# Patient Record
Sex: Female | Born: 2000 | Race: Black or African American | Hispanic: No | Marital: Single | Smoking: Never smoker
Health system: Southern US, Community
[De-identification: ages and names within clinical notes are randomized; demographics above are authoritative.]

## PROBLEM LIST (undated history)

## (undated) DIAGNOSIS — J45909 Unspecified asthma, uncomplicated: Secondary | ICD-10-CM

## (undated) DIAGNOSIS — F419 Anxiety disorder, unspecified: Secondary | ICD-10-CM

---
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
## 2018-11-29 ENCOUNTER — Other Ambulatory Visit: Payer: Self-pay

## 2018-11-29 DIAGNOSIS — Z20822 Contact with and (suspected) exposure to covid-19: Secondary | ICD-10-CM

## 2018-11-30 LAB — NOVEL CORONAVIRUS, NAA: SARS-CoV-2, NAA: NOT DETECTED

## 2019-05-02 ENCOUNTER — Other Ambulatory Visit: Payer: Self-pay

## 2019-05-02 ENCOUNTER — Ambulatory Visit: Payer: Medicaid Other | Attending: Internal Medicine

## 2019-05-02 DIAGNOSIS — Z20822 Contact with and (suspected) exposure to covid-19: Secondary | ICD-10-CM

## 2019-05-03 LAB — NOVEL CORONAVIRUS, NAA: SARS-CoV-2, NAA: NOT DETECTED

## 2020-07-08 ENCOUNTER — Ambulatory Visit
Admission: EM | Admit: 2020-07-08 | Discharge: 2020-07-08 | Disposition: A | Discharge status: Home / Self Care | Payer: 59

## 2020-07-08 ENCOUNTER — Other Ambulatory Visit: Payer: Self-pay

## 2020-07-08 ENCOUNTER — Encounter: Payer: Self-pay | Admitting: Emergency Medicine

## 2020-07-08 DIAGNOSIS — H60501 Unspecified acute noninfective otitis externa, right ear: Secondary | ICD-10-CM

## 2020-07-08 DIAGNOSIS — R0981 Nasal congestion: Secondary | ICD-10-CM | POA: Diagnosis not present

## 2020-07-08 HISTORY — DX: Unspecified asthma, uncomplicated: J45.909

## 2020-07-08 HISTORY — DX: Anxiety disorder, unspecified: F41.9

## 2020-07-08 MED ORDER — AMOXICILLIN 875 MG PO TABS: 875.0000 mg | ORAL_TABLET | Freq: Two times a day (BID) | ORAL | 0 refills | Status: AC

## 2020-07-08 MED ORDER — PSEUDOEPHEDRINE HCL 30 MG PO TABS: 30.0000 mg | ORAL_TABLET | ORAL | 0 refills | Status: DC | PRN

## 2020-07-08 NOTE — ED Provider Notes (Signed)
[facility]    CSN: [id number] Arrival date & time: 07/08/20  0932      History   Chief Complaint No chief complaint on file.   HPI Melissa Porter is a 20 y.o. female.   HPI  Patient presents today with right ear pain, nasal congestion, and  nasal drainage x 2 days. History of asthma, however, no asthma symptoms present over the last few days. She has taken Mucinex and Nyquil without relief of symptoms. Past Medical History:  Diagnosis Date  . Anxiety   . Asthma     There are no problems to display for this patient.   History reviewed. No pertinent surgical history.  OB History   No obstetric history on file.      Home Medications    Prior to Admission medications   Medication Sig Start Date End Date Taking? Authorizing Provider  albuterol (VENTOLIN HFA) 108 (90 Base) MCG/ACT inhaler Inhale into the lungs every 6 (six) hours as needed for wheezing or shortness of breath.   Yes [provider]  atomoxetine (STRATTERA) 40 MG capsule Take 40 mg by mouth daily.   Yes [provider]  escitalopram (LEXAPRO) 10 MG tablet Take 10 mg by mouth daily.   Yes [provider]  montelukast (SINGULAIR) 10 MG tablet Take 10 mg by mouth at bedtime.   Yes [provider]  triamcinolone (KENALOG) 0.1 % Apply 1 application topically 2 (two) times daily.   Yes [provider]    Family History History reviewed. No pertinent family history.  Social History Social History   Tobacco Use  . Smoking status: Never Smoker  . Smokeless tobacco: Never Used  Substance Use Topics  . Alcohol use: No  . Drug use: Yes    Types: Marijuana     Allergies   Nickel   Review of Systems Review of Systems Pertinent negatives listed in HPI  Physical Exam Triage Vital Signs ED Triage Vitals  Enc Vitals Group     BP 07/08/20 0947 123/76     Pulse Rate 07/08/20 0947 100     Resp 07/08/20 0947 18     Temp 07/08/20 0947 98.6 F (37  C)     Temp Source 07/08/20 0947 Oral     SpO2 07/08/20 0947 98 %     Weight --      Height --      Head Circumference --      Peak Flow --      Pain Score 07/08/20 0948 8     Pain Loc --      Pain Edu? --      Excl. in GC? --    No data found.  Updated Vital Signs BP 123/76 (BP Location: Right Arm)   Pulse 100   Temp 98.6 F (37 C) (Oral)   Resp 18   LMP 06/27/2020   SpO2 98%   Visual Acuity Right Eye Distance:   Left Eye Distance:   Bilateral Distance:    Right Eye Near:   Left Eye Near:    Bilateral Near:     Physical Exam  General Appearance:    Alert, cooperative, no distress  HENT:   Normocephalic, right ear TM erythematous w/ pain and postauricular adenopathy, ares mucosal edema with congestion, rhinorrhea, oropharynx    Eyes:    PERRL, conjunctiva/corneas clear, EOM's intact       Lungs:     Clear to auscultation bilaterally, respirations unlabored  Heart:    Regular rate and rhythm  Neurologic:   Awake, alert, oriented x 3. No apparent focal neurological           defect.      UC Treatments / Results  Labs (all labs ordered are listed, but only abnormal results are displayed) Labs Reviewed - No data to display  EKG   Radiology No results found.  Procedures Procedures (including critical care time)  Medications Ordered in UC Medications - No data to display  Initial Impression / Assessment and Plan / UC Course  I have reviewed the triage vital signs and the nursing notes.  Pertinent labs & imaging results that were available during my care of the patient were reviewed by me and considered in my medical decision making (see chart for details).     Otitis media, right-tx Amoxicillin 875 mg x 7 days  Nasal congestion trial Sudafed Continue Singulair  Final Clinical Impressions(s) / UC Diagnoses   Final diagnoses:  Nasal congestion  Acute otitis externa of right ear, unspecified type   Discharge Instructions   None    ED  Prescriptions    Medication Sig Dispense Auth. Provider   amoxicillin (AMOXIL) 875 MG tablet Take 1 tablet (875 mg total) by mouth 2 (two) times daily for 7 days. 14 tablet Bing Neighbors, FNP   pseudoephedrine (SUDAFED) 30 MG tablet Take 1 tablet (30 mg total) by mouth every 4 (four) hours as needed for congestion. 30 tablet Bing Neighbors, FNP     PDMP not reviewed this encounter.   Bing Neighbors, FNP 07/09/20 2312

## 2020-07-08 NOTE — ED Triage Notes (Signed)
Runny nose, nasal congestion - yellowish, ear hurts, hurts to swallow x 2 days.

## 2022-10-18 ENCOUNTER — Encounter (HOSPITAL_COMMUNITY): Payer: Self-pay

## 2022-10-18 ENCOUNTER — Ambulatory Visit (HOSPITAL_COMMUNITY)
Admission: EM | Admit: 2022-10-18 | Discharge: 2022-10-18 | Disposition: A | Discharge status: Home / Self Care | Payer: 59 | Attending: Emergency Medicine | Admitting: Emergency Medicine

## 2022-10-18 DIAGNOSIS — B349 Viral infection, unspecified: Secondary | ICD-10-CM | POA: Diagnosis present

## 2022-10-18 LAB — POCT RAPID STREP A (OFFICE): Rapid Strep A Screen: NEGATIVE

## 2022-10-18 MED ORDER — IBUPROFEN 800 MG PO TABS
ORAL_TABLET | ORAL | Status: AC
  Filled 2022-10-18: qty 1

## 2022-10-18 MED ORDER — IBUPROFEN 800 MG PO TABS
800.0000 mg | ORAL_TABLET | Freq: Once | ORAL | Status: AC
  Administered 2022-10-18: 800 mg via ORAL

## 2022-10-18 NOTE — ED Provider Notes (Signed)
[facility]    CSN: [id number] Arrival date & time: 10/18/22  1007      History   Chief Complaint Chief Complaint  Patient presents with   Sore Throat   Ear Pain   Fever    HPI Melissa Porter is a 22 y.o. female.   22 year old Melissa Porter presents to Urgent care with c/o of sore throat, ear pain, fever x 1 day, co worker is sick. Pt took Nyquil for symptoms, has 102.1 temp in office, will order ibuprofen for fever.  Pt reports past Med hx of asthma, no current flare or symptoms related to asthma today. Pt states she was on Amoxicillin and finished ~ 2 weeks prior for sinus infection,stopped taking abx as she developed yeast infection and diarrhea, also started feeling better.   The history is provided by the patient. No language interpreter was used.    Past Medical History:  Diagnosis Date   Anxiety    Asthma     Patient Active Problem List   Diagnosis Date Noted   Nonspecific syndrome suggestive of viral illness 10/18/2022    History reviewed. No pertinent surgical history.  OB History   No obstetric history on file.      Home Medications    Prior to Admission medications   Medication Sig Start Date End Date Taking? Authorizing Provider  albuterol (VENTOLIN HFA) 108 (90 Base) MCG/ACT inhaler Inhale into the lungs every 6 (six) hours as needed for wheezing or shortness of breath.   Yes [provider]  escitalopram (LEXAPRO) 10 MG tablet Take 10 mg by mouth daily.   Yes [provider]  montelukast (SINGULAIR) 10 MG tablet Take 10 mg by mouth at bedtime.   Yes [provider]  pantoprazole (PROTONIX) 40 MG tablet Take 40 mg by mouth daily. 10/15/22  Yes [provider]  atomoxetine (STRATTERA) 40 MG capsule Take 40 mg by mouth daily.    [provider]  pseudoephedrine (SUDAFED) 30 MG tablet Take 1 tablet (30 mg total) by mouth every 4 (four) hours as needed for congestion. 07/08/20   Bing Neighbors, NP   triamcinolone (KENALOG) 0.1 % Apply 1 application topically 2 (two) times daily.    [provider]    Family History History reviewed. No pertinent family history.  Social History Social History   Tobacco Use   Smoking status: Never   Smokeless tobacco: Never  Substance Use Topics   Alcohol use: No   Drug use: Yes    Types: Marijuana     Allergies   Nickel   Review of Systems Review of Systems  Constitutional:  Positive for fever.  HENT:  Positive for ear pain and sore throat.   Respiratory:  Negative for cough.   All other systems reviewed and are negative.    Physical Exam Triage Vital Signs ED Triage Vitals  Enc Vitals Group     BP      Pulse      Resp      Temp      Temp src      SpO2      Weight      Height      Head Circumference      Peak Flow      Pain Score      Pain Loc      Pain Edu?      Excl. in GC?    No data found.  Updated  Vital Signs BP 114/78 (BP Location: Left Arm)   Pulse (!) 108   Temp (!) 102.1 F (38.9 C) (Oral)   Resp 18   LMP 09/16/2022 (Approximate)   SpO2 94%   Visual Acuity Right Eye Distance:   Left Eye Distance:   Bilateral Distance:    Right Eye Near:   Left Eye Near:    Bilateral Near:     Physical Exam Vitals and nursing note reviewed.  Constitutional:      General: She is not in acute distress.    Appearance: She is well-developed and well-groomed.  HENT:     Head: Normocephalic and atraumatic.     Right Ear: Tympanic membrane is retracted.     Left Ear: Tympanic membrane is retracted.     Nose: Nose normal.     Mouth/Throat:     Lips: Pink.     Mouth: Mucous membranes are moist.     Pharynx: Uvula midline. Posterior oropharyngeal erythema present. No pharyngeal swelling, oropharyngeal exudate or uvula swelling.  Eyes:     General: Lids are normal. Vision grossly intact.     Conjunctiva/sclera: Conjunctivae normal.     Pupils: Pupils are equal, round, and reactive to light.  Neck:      Trachea: Trachea normal.  Cardiovascular:     Rate and Rhythm: Regular rhythm. Tachycardia present.     Pulses: Normal pulses.     Heart sounds: Normal heart sounds. No murmur heard. Pulmonary:     Effort: Pulmonary effort is normal. No respiratory distress.     Breath sounds: Normal breath sounds.  Abdominal:     Palpations: Abdomen is soft.     Tenderness: There is no abdominal tenderness.  Musculoskeletal:        General: No swelling.     Cervical back: Normal range of motion and neck supple.  Lymphadenopathy:     Cervical: No cervical adenopathy.  Skin:    General: Skin is warm and dry.     Capillary Refill: Capillary refill takes less than 2 seconds.  Neurological:     General: No focal deficit present.     Mental Status: She is alert and oriented to person, place, and time.     GCS: GCS eye subscore is 4. GCS verbal subscore is 5. GCS motor subscore is 6.     Cranial Nerves: Cranial nerves 2-12 are intact.     Sensory: Sensation is intact.     Motor: Motor function is intact.     Coordination: Coordination is intact.     Gait: Gait is intact.  Psychiatric:        Attention and Perception: Attention normal.        Mood and Affect: Mood normal.        Speech: Speech normal.        Behavior: Behavior normal. Behavior is cooperative.      UC Treatments / Results  Labs (all labs ordered are listed, but only abnormal results are displayed) Labs Reviewed  CULTURE, GROUP A STREP [facility])  POCT RAPID STREP A (OFFICE)    EKG   Radiology No results found.  Procedures Procedures (including critical care time)  Medications Ordered in UC Medications  ibuprofen (ADVIL) tablet 800 mg (800 mg Oral Given 10/18/22 1034)    Initial Impression / Assessment and Plan / UC Course  I have reviewed the triage vital signs and the nursing notes.  Pertinent labs & imaging results that were available during my care  of the patient were reviewed by me and considered in my medical  decision making (see chart for details).    Discussed exam findings and plan of care with pt, pt verbalized understanding to this provider.  Ddx: Viral illness, allergies  Final Clinical Impressions(s) / UC Diagnoses   Final diagnoses:  Nonspecific syndrome suggestive of viral illness     Discharge Instructions      You were tested for strep,it was negative, culture pending, check my chart for results.  May use over the counter meds for symptom management (Dayquil,Nyquil,tylenol,ibuprofen, afrin,chloraseptic throat lozenges as label directed: Do not take tylenol while taking Dayquil or Nyquil as it has tylenol already in the medication). Push fluids: gatorade, pedilyte, jello,popsicles, chicken noodle soup,etc. Avoid caffeine products. Return as needed.      ED Prescriptions   None    PDMP not reviewed this encounter.   Clancy Gourd, NP 10/18/22 1354

## 2022-10-18 NOTE — ED Triage Notes (Signed)
Pt presents with sore throat,ear pain and fever x 1 day.  Pt states her co-worker has been sick. She is taking Nyquil.

## 2022-10-18 NOTE — ED Triage Notes (Signed)
Also, pt states she was on Amox for a sinus infection but did not complete the treatment.

## 2022-10-18 NOTE — Discharge Instructions (Addendum)
You were tested for strep,it was negative, culture pending, check my chart for results.  May use over the counter meds for symptom management (Dayquil,Nyquil,tylenol,ibuprofen, afrin,chloraseptic throat lozenges as label directed: Do not take tylenol while taking Dayquil or Nyquil as it has tylenol already in the medication). Push fluids: gatorade, pedilyte, jello,popsicles, chicken noodle soup,etc. Avoid caffeine products. Return as needed.

## 2022-10-19 LAB — CULTURE, GROUP A STREP (THRC)

## 2022-10-20 LAB — CULTURE, GROUP A STREP (THRC)

## 2022-11-07 ENCOUNTER — Ambulatory Visit: Payer: 59 | Admitting: Allergy

## 2022-11-27 ENCOUNTER — Ambulatory Visit: Payer: 59 | Admitting: Allergy

## 2022-12-17 ENCOUNTER — Ambulatory Visit: Payer: 59 | Admitting: Allergy

## 2023-01-15 ENCOUNTER — Ambulatory Visit: Payer: 59 | Admitting: Allergy

## 2023-04-28 ENCOUNTER — Encounter: Payer: Self-pay | Admitting: Family

## 2023-04-28 ENCOUNTER — Ambulatory Visit: Payer: 59 | Admitting: Family

## 2023-04-28 ENCOUNTER — Other Ambulatory Visit (HOSPITAL_COMMUNITY)
Admission: RE | Admit: 2023-04-28 | Discharge: 2023-04-28 | Disposition: A | Discharge status: Home / Self Care | Payer: 59 | Source: Ambulatory Visit | Attending: Family | Admitting: Family

## 2023-04-28 VITALS — BP 122/78 | HR 78 | Temp 98.0°F | Ht 67.0 in | Wt 203.2 lb

## 2023-04-28 DIAGNOSIS — F900 Attention-deficit hyperactivity disorder, predominantly inattentive type: Secondary | ICD-10-CM

## 2023-04-28 DIAGNOSIS — Z01419 Encounter for gynecological examination (general) (routine) without abnormal findings: Secondary | ICD-10-CM

## 2023-04-28 DIAGNOSIS — Z13220 Encounter for screening for lipoid disorders: Secondary | ICD-10-CM | POA: Diagnosis not present

## 2023-04-28 DIAGNOSIS — Z124 Encounter for screening for malignant neoplasm of cervix: Secondary | ICD-10-CM | POA: Diagnosis not present

## 2023-04-28 DIAGNOSIS — Z114 Encounter for screening for human immunodeficiency virus [HIV]: Secondary | ICD-10-CM

## 2023-04-28 DIAGNOSIS — J3089 Other allergic rhinitis: Secondary | ICD-10-CM | POA: Diagnosis not present

## 2023-04-28 DIAGNOSIS — K219 Gastro-esophageal reflux disease without esophagitis: Secondary | ICD-10-CM

## 2023-04-28 DIAGNOSIS — Z0000 Encounter for general adult medical examination without abnormal findings: Secondary | ICD-10-CM | POA: Diagnosis not present

## 2023-04-28 DIAGNOSIS — Z1159 Encounter for screening for other viral diseases: Secondary | ICD-10-CM

## 2023-04-28 DIAGNOSIS — A5901 Trichomonal vulvovaginitis: Secondary | ICD-10-CM

## 2023-04-28 LAB — COMPREHENSIVE METABOLIC PANEL
ALT: 19 U/L (ref 0–35)
AST: 33 U/L (ref 0–37)
Albumin: 4 g/dL (ref 3.5–5.2)
Alkaline Phosphatase: 87 U/L (ref 39–117)
BUN: 9 mg/dL (ref 6–23)
CO2: 26 meq/L (ref 19–32)
Calcium: 9 mg/dL (ref 8.4–10.5)
Chloride: 106 meq/L (ref 96–112)
Creatinine, Ser: 0.83 mg/dL (ref 0.40–1.20)
GFR: 98.76 mL/min (ref >60.00)
Glucose, Bld: 88 mg/dL (ref 70–99)
Potassium: 3.8 meq/L (ref 3.5–5.1)
Sodium: 140 meq/L (ref 135–145)
Total Bilirubin: 0.3 mg/dL (ref 0.2–1.2)
Total Protein: 7.3 g/dL (ref 6.0–8.3)

## 2023-04-28 LAB — CBC WITH DIFFERENTIAL/PLATELET
Basophils Absolute: 0 10*3/uL (ref 0.0–0.1)
Basophils Relative: 0.5 % (ref 0.0–3.0)
Eosinophils Absolute: 0.1 10*3/uL (ref 0.0–0.7)
Eosinophils Relative: 1.2 % (ref 0.0–5.0)
HCT: 38.8 % (ref 36.0–46.0)
Hemoglobin: 12.5 g/dL (ref 12.0–15.0)
Lymphocytes Relative: 27.9 % (ref 12.0–46.0)
Lymphs Abs: 1.6 10*3/uL (ref 0.7–4.0)
MCHC: 32.1 g/dL (ref 30.0–36.0)
MCV: 82.5 fL (ref 78.0–100.0)
Monocytes Absolute: 0.4 10*3/uL (ref 0.1–1.0)
Monocytes Relative: 6.2 % (ref 3.0–12.0)
Neutro Abs: 3.8 10*3/uL (ref 1.4–7.7)
Neutrophils Relative %: 64.2 % (ref 43.0–77.0)
Platelets: 269 10*3/uL (ref 150.0–400.0)
RBC: 4.71 Mil/uL (ref 3.87–5.11)
RDW: 15.4 % (ref 11.5–15.5)
WBC: 5.9 10*3/uL (ref 4.0–10.5)

## 2023-04-28 LAB — TSH: TSH: 1.21 u[IU]/mL (ref 0.35–5.50)

## 2023-04-28 LAB — LIPID PANEL
Cholesterol: 220 mg/dL — ABNORMAL HIGH (ref 0–200)
HDL: 59.4 mg/dL (ref >39.00)
LDL Cholesterol: 142 mg/dL — ABNORMAL HIGH (ref 0–99)
NonHDL: 160.71
Total CHOL/HDL Ratio: 4
Triglycerides: 93 mg/dL (ref 0.0–149.0)
VLDL: 18.6 mg/dL (ref 0.0–40.0)

## 2023-04-28 MED ORDER — PANTOPRAZOLE SODIUM 20 MG PO TBEC: 20.0000 mg | DELAYED_RELEASE_TABLET | Freq: Every day | ORAL | 0 refills | Status: DC

## 2023-04-28 NOTE — Patient Instructions (Addendum)
 Welcome to [facility], It was a pleasure meeting you today!   I will review your lab results via MyChart in a few days.  Please schedule a follow up visit when you need to refill your Strattera .    PLEASE NOTE: If you had any LAB tests please let us  know if you have not heard back within a few days. You may see your results on MyChart before we have a chance to review them but we will give you a call once they are reviewed by us . If we ordered any REFERRALS today, please let us  know if you have not heard from their office within the next week.  Let us  know through MyChart if you are needing REFILLS, or have your pharmacy send us  the request. You can also use MyChart to communicate with me or any office staff.  Please try these tips to maintain a healthy lifestyle: It is important that you exercise regularly at least 30 minutes 5 times a week. Think about what you will eat, plan ahead. Choose whole foods, & think  clean, green, fresh or frozen over canned, processed or packaged foods which are more sugary, salty, and fatty. 70 to 75% of food eaten should be fresh vegetables and protein. 2-3  meals daily with healthy snacks between meals, but must be whole fruit, protein or vegetables. Aim to eat over a 10 hour period when you are active, for example, 7am to 5pm, and then STOP after your last meal of the day, drinking only water.  Shorter eating windows, 6-8 hours, are showing benefits in heart disease and blood sugar regulation. Drink water every day! Shoot for 64 ounces daily = 8 cups, no other drink is as healthy! Fruit juice is best enjoyed in a healthy way, by EATING the fruit.

## 2023-04-28 NOTE — Progress Notes (Signed)
 Phone [phone]  Subjective:   Patient is a 23 y.o. female presenting for annual physical.    Chief Complaint  Patient presents with   New Patient (Initial Visit)   Annual Exam    Fasting w/ labs   Gynecologic Exam   Discussed the use of AI scribe software for clinical note transcription with the patient, who gave verbal consent to proceed.  History of Present Illness   The patient is a single, sexually active female who is currently celibate and studying surgical technology. She presents for a physical and to establish care. She has concerns about her ADHD medication, Strattera , which she has been on since about age 70. She reports feelings of sadness and a funky feeling when she misses a dose. She also mentions that she stopped taking the medication for a while to see how she would feel without it, and during that time, she felt kind of crazy. She has recently resumed taking the medication, but expresses concerns whether it is helping enough with symptoms. She did try 80mg  dose but states this made her feel more depressed so she went back down to 40mg  qd. In addition to her ADHD, the patient has a history of acid reflux, for which she takes Protonix  40mg  qd. She reports an incident of severe vomiting after which her acid reflux symptoms worsened. She also mentions that she has always been constipated, which she attributes to her diet and lack of water intake. The patient is due for a Pap smear and is open to STD testing. She is considering becoming a vegan and is currently participating in a boot camp qd for exercise.      See problem oriented charting- ROS- full  review of systems was completed and negative.  The following were reviewed and entered/updated in epic: Past Medical History:  Diagnosis Date   Anxiety    Asthma    There are no active problems to display for this patient.  History reviewed. No pertinent surgical history.  History reviewed. No pertinent family  history.  Medications- reviewed and updated Current Outpatient Medications  Medication Sig Dispense Refill   atomoxetine  (STRATTERA ) 40 MG capsule Take 40 mg by mouth daily.     FLUTICASONE  FUROATE NA Place into the nose.     montelukast  (SINGULAIR ) 10 MG tablet Take 10 mg by mouth at bedtime.     pantoprazole  (PROTONIX ) 40 MG tablet Take 20 mg by mouth daily.     TRI-SPRINTEC  0.18/0.215/0.25 MG-35 MCG tablet Take 1 tablet by mouth daily.     triamcinolone  (KENALOG ) 0.1 % Apply 1 application topically 2 (two) times daily.     No current facility-administered medications for this visit.    Allergies-reviewed and updated Allergies  Allergen Reactions   Nickel    Orange Grape Root    Social History   Social History Narrative   Not on file   Objective:  BP 122/78 (BP Location: Left Arm, Patient Position: Sitting, Cuff Size: Large)   Pulse 78   Temp 98 F (36.7 C) (Temporal)   Ht 5' 7 (1.702 m)   Wt 203 lb 4 oz (92.2 kg)   LMP 04/07/2023 (Exact Date)   SpO2 99%   BMI 31.83 kg/m  Physical Exam Vitals and nursing note reviewed.  Constitutional:      Appearance: Normal appearance. She is obese.  HENT:     Head: Normocephalic.     Right Ear: Tympanic membrane normal.     Left Ear: Tympanic membrane  normal.     Nose: Nose normal.     Mouth/Throat:     Mouth: Mucous membranes are moist.  Eyes:     Pupils: Pupils are equal, round, and reactive to light.  Cardiovascular:     Rate and Rhythm: Normal rate and regular rhythm.  Pulmonary:     Effort: Pulmonary effort is normal.     Breath sounds: Normal breath sounds.  Musculoskeletal:        General: Normal range of motion.     Cervical back: Normal range of motion.  Lymphadenopathy:     Cervical: No cervical adenopathy.  Skin:    General: Skin is warm and dry.  Neurological:     Mental Status: She is alert.  Psychiatric:        Mood and Affect: Mood normal.        Behavior: Behavior normal.     Assessment and  Plan   Health Maintenance counseling: 1. Anticipatory guidance: Patient counseled regarding regular dental exams q6 months, eye exams,  avoiding smoking and second hand smoke, limiting alcohol to 1 beverage per day, no illicit drugs.   2. Risk factor reduction:  Advised patient of need for regular exercise and diet rich with fruits and vegetables to reduce risk of heart attack and stroke. Wt Readings from Last 3 Encounters:  04/28/23 203 lb 4 oz (92.2 kg)  06/17/15 145 lb (65.8 kg) (83%, Z= 0.97)*   * Growth percentiles are based on CDC (Girls, 2-20 Years) data.   3. Immunizations/screenings/ancillary studies Immunization History  Administered Date(s) Administered   Influenza-Unspecified 02/07/2023   Health Maintenance Due  Topic Date Due   HPV VACCINES (1 - 3-dose series) Never done   HIV Screening  Never done   DTaP/Tdap/Td (1 - Tdap) Never done   Cervical Cancer Screening (Pap smear)  Never done   COVID-19 Vaccine (1 - 2024-25 season) Never done    4. Cervical cancer screening: 3-84yrs ago normal, will obtain today. 5. Skin cancer screening- advised regular sunscreen use. Denies worrisome, changing, or new skin lesions.  6. Birth control/STD check: OCP - checking STD screen today 7. Smoking associated screening: non- smoker 8. Alcohol screening: rare  Assessment and Plan    Annual Physical -  Overdue for Pap smear, last one was normal but more than 3 years ago. VSS. -Perform Pap smear today. Pt on daily OCP, no refill needed today. -Obtain labs, CMP, TSH, CBC, Lipids, Hepatitis C screening -Continue healthy diet and exercise regimen.  Gastroesophageal Reflux Disease (GERD) - Chronic, stable; On Protonix  40mg  daily, symptoms started after a vomiting episode. Discussed dietary modifications and the importance of not suppressing all gastric acid with high dose daily antacid. -Reduce Protonix  to 20mg  daily. -Consider dietary modifications to manage symptoms. -F/U 6  mos  Allergic Rhinitis - On Montelukast  for allergies. -Continue Montelukast  as prescribed.  Attention Deficit Hyperactivity Disorder (ADHD) - On Strattera , has been on it since childhood. Discussed potential side effects and benefits. -Continue Strattera  as prescribed. -Schedule follow up visit to discuss other options before refill is due.  Anxiety - Previously tried Escitalopram but stopped due to side effects. Pt prefers to try potential natural remedies. -Consider trying low dose Ashwagandha, mindful of potential interactions. -Consider therapy for additional support. -F/U prn  Constipation - Chronic issue, discussed importance of hydration and dietary fiber. Decreasing Protonix  may also help. -Increase water intake to 2.5L qd, continue daily exercise. -Consider fiber supplements if necessary.    Recommended follow  up:  Return for ADHD f/u. No future appointments.   Lab/Order associations:fasting    Lucius Krabbe, NP

## 2023-04-28 NOTE — Assessment & Plan Note (Signed)
 Chronic, stable On Montelukast for allergies. -Continue Montelukast as prescribed. -F/U 6 mos

## 2023-04-28 NOTE — Assessment & Plan Note (Signed)
 Chronic, stable. On Strattera, has been on it since childhood. Discussed potential side effects and benefits. -Continue Strattera as prescribed. -Schedule follow up visit to discuss other options before refill is due.

## 2023-04-28 NOTE — Assessment & Plan Note (Signed)
 Chronic, stable; On Protonix  40mg  daily, symptoms started after a vomiting episode. Discussed dietary modifications and the importance of not suppressing all gastric acid with high dose daily antacid. -Reduce Protonix  to 20mg  daily. -Consider dietary modifications to manage symptoms. -F/U 6 mos

## 2023-04-29 ENCOUNTER — Telehealth: Payer: Self-pay | Admitting: Family

## 2023-04-29 LAB — HEPATITIS C ANTIBODY: Hepatitis C Ab: NONREACTIVE

## 2023-04-29 LAB — HIV ANTIBODY (ROUTINE TESTING W REFLEX): HIV 1&2 Ab, 4th Generation: NONREACTIVE

## 2023-04-29 NOTE — Telephone Encounter (Signed)
 FYI,  I returned pt's call, and let pt know Judeth Cornfield has not had a chance to review labs yet but once does someone from the office will contact her or she will receive a MyChart message. Pt verbalized understanding.

## 2023-04-29 NOTE — Telephone Encounter (Signed)
 Patient is calling in to discuss her ;ab work she would like a call back regarding this

## 2023-04-30 LAB — CYTOLOGY - PAP
Adequacy: ABSENT
Chlamydia: NEGATIVE
Comment: NEGATIVE
Comment: NEGATIVE
Comment: NEGATIVE
Comment: NORMAL
Diagnosis: NEGATIVE
High risk HPV: NEGATIVE
Neisseria Gonorrhea: NEGATIVE
Trichomonas: POSITIVE — AB

## 2023-05-01 ENCOUNTER — Encounter: Payer: Self-pay | Admitting: Family

## 2023-05-01 MED ORDER — METRONIDAZOLE 500 MG PO TABS: 500.0000 mg | ORAL_TABLET | Freq: Two times a day (BID) | ORAL | 0 refills | Status: AC

## 2023-05-01 NOTE — Addendum Note (Signed)
 Addended byDulce Sellar on: 05/01/2023 05:27 PM   Modules accepted: Orders

## 2023-05-01 NOTE — Telephone Encounter (Signed)
 Lab review message sent to pt via MyChart

## 2023-05-29 ENCOUNTER — Encounter: Payer: Self-pay | Admitting: Family

## 2023-05-29 ENCOUNTER — Ambulatory Visit: Payer: 59 | Admitting: Family

## 2023-05-29 VITALS — BP 124/79 | HR 71 | Temp 97.8°F | Ht 67.0 in | Wt 202.0 lb

## 2023-05-29 DIAGNOSIS — F900 Attention-deficit hyperactivity disorder, predominantly inattentive type: Secondary | ICD-10-CM | POA: Diagnosis not present

## 2023-05-29 DIAGNOSIS — Z3041 Encounter for surveillance of contraceptive pills: Secondary | ICD-10-CM | POA: Diagnosis not present

## 2023-05-29 DIAGNOSIS — J3089 Other allergic rhinitis: Secondary | ICD-10-CM | POA: Diagnosis not present

## 2023-05-29 DIAGNOSIS — L309 Dermatitis, unspecified: Secondary | ICD-10-CM | POA: Insufficient documentation

## 2023-05-29 MED ORDER — ATOMOXETINE HCL 40 MG PO CAPS: 40.0000 mg | ORAL_CAPSULE | Freq: Every day | ORAL | 1 refills | Status: DC

## 2023-05-29 MED ORDER — TRIAMCINOLONE ACETONIDE 0.1 % EX CREA: 1.0000 | TOPICAL_CREAM | Freq: Two times a day (BID) | CUTANEOUS | 5 refills | Status: DC

## 2023-05-29 MED ORDER — TRI-SPRINTEC 0.18/0.215/0.25 MG-35 MCG PO TABS: 1.0000 | ORAL_TABLET | Freq: Every day | ORAL | 3 refills | Status: AC

## 2023-05-29 MED ORDER — MONTELUKAST SODIUM 10 MG PO TABS: 10.0000 mg | ORAL_TABLET | Freq: Every day | ORAL | 1 refills | Status: DC

## 2023-05-29 NOTE — Assessment & Plan Note (Signed)
 Stable on Montelukast  10mg  qhs and Flovent  inhaler qd. -Continue Montelukast  at bedtime and inhaler. -Attempt to refill for 90 days with 1 refill (6 months' worth). -Schedule follow-up appointment in 6 months.

## 2023-05-29 NOTE — Assessment & Plan Note (Signed)
 Stable on current topical treatment. -Continue Triamcinolone  0.1% cream . Refill for 90 days with 1 refill (6 months' worth). -Schedule follow-up appointment in 6 months.

## 2023-05-29 NOTE — Assessment & Plan Note (Signed)
 Stable on Strattera  40mg  daily. No current concerns about focus or attention, denies any SE. -Continue Strattera  40mg  daily. -Refill for 90 days with 1 refill (6 months' worth). -Schedule follow-up appointment in 6 months.

## 2023-05-29 NOTE — Progress Notes (Signed)
 Patient ID: Melissa Porter, female    DOB: 02-Oct-2000, 23 y.o.   MRN: [id number]  Chief Complaint  Patient presents with   ADHD    Follow up   Eczema    Medication refill.    Contraception       Discussed the use of AI scribe software for clinical note transcription with the patient, who gave verbal consent to proceed.  History of Present Illness   Melissa Porter is a 23 year old female who presents for medication refills.  She is currently taking Strattera  40 mg, for ADHD  montelukast  for allergies, and birth control pills. She typically receives a 30-day supply of montelukast  but is open to a 90-day supply if her insurance allows. Her birth control prescription is usually dispensed in three-month packs. No issues with her current medication regimen and no recent changes in symptoms. She previously considered a higher dose of Strattera  due to focus and attention difficulties but currently feels her symptoms are well-managed on the current dose. No ongoing problems with focus and attention. She uses a steroid cream for eczema as needed.     Assessment & Plan:     Attention Deficit Disorder - Stable on Strattera  40mg  daily. No current concerns about focus or attention, denies any SE. -Continue Strattera  40mg  daily. -Refill for 90 days with 1 refill (6 months' worth). -Schedule follow-up appointment in 6 months.  Contraception - Stable on current birth control regimen, Tri-Sprintec  OCP. -Continue current birth control regimen, sending refill. -Refill for 1 year.  Asthma/Allergies - Stable on Montelukast  10mg  qhs and Flovent  inhaler qd. -Continue Montelukast  at bedtime and inhaler. -Attempt to refill for 90 days with 1 refill (6 months' worth). -Schedule follow-up appointment in 6 months.  Eczema - Stable on current topical treatment. -Continue Triamcinolone  0.1% cream . Refill for 90 days with 1 refill (6 months' worth). -Schedule follow-up appointment in 6 months.       Subjective:    Outpatient Medications Prior to Visit  Medication Sig Dispense Refill   FLUTICASONE  FUROATE NA Place into the nose.     pantoprazole  (PROTONIX ) 20 MG tablet Take 1 tablet (20 mg total) by mouth daily. 90 tablet 0   atomoxetine  (STRATTERA ) 40 MG capsule Take 40 mg by mouth daily.     montelukast  (SINGULAIR ) 10 MG tablet Take 10 mg by mouth at bedtime.     TRI-SPRINTEC  0.18/0.215/0.25 MG-35 MCG tablet Take 1 tablet by mouth daily.     triamcinolone  (KENALOG ) 0.1 % Apply 1 application topically 2 (two) times daily.     No facility-administered medications prior to visit.   Past Medical History:  Diagnosis Date   Anxiety    Asthma    History reviewed. No pertinent surgical history. Allergies  Allergen Reactions   Nickel    Orange Grape Root       Objective:    Physical Exam Vitals and nursing note reviewed.  Constitutional:      Appearance: Normal appearance.  Cardiovascular:     Rate and Rhythm: Normal rate and regular rhythm.  Pulmonary:     Effort: Pulmonary effort is normal.     Breath sounds: Normal breath sounds.  Musculoskeletal:        General: Normal range of motion.  Skin:    General: Skin is warm and dry.  Neurological:     Mental Status: She is alert.  Psychiatric:        Mood and Affect: Mood normal.  Behavior: Behavior normal.    BP 124/79 (BP Location: Left Arm, Patient Position: Sitting, Cuff Size: Large)   Pulse 71   Temp 97.8 F (36.6 C) (Temporal)   Ht 5' 7 (1.702 m)   Wt 202 lb (91.6 kg)   SpO2 100%   BMI 31.64 kg/m  Wt Readings from Last 3 Encounters:  05/29/23 202 lb (91.6 kg)  04/28/23 203 lb 4 oz (92.2 kg)  06/17/15 145 lb (65.8 kg) (83%, Z= 0.97)*   * Growth percentiles are based on CDC (Girls, 2-20 Years) data.      Lucius Krabbe, NP

## 2023-06-10 ENCOUNTER — Telehealth: Payer: Self-pay | Admitting: *Deleted

## 2023-06-10 ENCOUNTER — Telehealth: Payer: Self-pay | Admitting: Family

## 2023-06-10 ENCOUNTER — Encounter: Payer: Self-pay | Admitting: Family

## 2023-06-10 ENCOUNTER — Telehealth: Payer: Self-pay

## 2023-06-10 DIAGNOSIS — L309 Dermatitis, unspecified: Secondary | ICD-10-CM

## 2023-06-10 MED ORDER — TRIAMCINOLONE ACETONIDE 0.1 % EX CREA: 1.0000 | TOPICAL_CREAM | Freq: Two times a day (BID) | CUTANEOUS | 0 refills | Status: DC

## 2023-06-10 NOTE — Telephone Encounter (Signed)
 Please see pt msg regarding amount of cream sent to pharmacy and advise if anything further needed to be sent for this patient

## 2023-06-10 NOTE — Telephone Encounter (Signed)
 Copied from CRM [id number]. Topic: Clinical - Request for Lab/Test Order >> Jun 10, 2023 10:56 AM Lennart Pall wrote: Reason for CRM: Patient wanting to get lab work done to check her cholesterol around March 14-17th.   Please see pt call note and request and advise on placing future lab orders

## 2023-06-10 NOTE — Telephone Encounter (Signed)
 Copied from CRM [id number]. Topic: Clinical - Request for Lab/Test Order >> Jun 10, 2023 10:56 AM Lennart Pall wrote: Reason for CRM: Patient wanting to get lab work done to check her cholesterol around March 14-17th.

## 2023-06-10 NOTE — Telephone Encounter (Signed)
 Copied from CRM [id number]. Topic: Clinical - Medication Question >> Jun 10, 2023  8:48 AM Larwance Sachs wrote: Reason for CRM: Patient called in regarding triamcinolone cream (KENALOG) 0.1 % stated a jar was requested and instead a tube was sent in to pharmacy, please reach out to patient to advise if jar will be sent or not at [phone]

## 2023-06-11 ENCOUNTER — Other Ambulatory Visit: Payer: Self-pay

## 2023-06-11 DIAGNOSIS — Z13220 Encounter for screening for lipoid disorders: Secondary | ICD-10-CM

## 2023-06-11 NOTE — Telephone Encounter (Signed)
 I sent refill for the jar, but let her know fyi insurance may reject this amount and I have no control over that.

## 2023-06-11 NOTE — Telephone Encounter (Signed)
 Please see notes and call pt to schedule lab only appt as requested

## 2023-06-11 NOTE — Telephone Encounter (Signed)
 I called pt and LVM in regards to message above.

## 2023-06-11 NOTE — Telephone Encounter (Signed)
 Please schedule lab appt, see previews note  Order placed

## 2023-07-03 ENCOUNTER — Encounter: Payer: Self-pay | Admitting: Family

## 2023-07-03 ENCOUNTER — Other Ambulatory Visit

## 2023-07-03 ENCOUNTER — Ambulatory Visit: Admitting: Family

## 2023-07-03 ENCOUNTER — Other Ambulatory Visit: Payer: 59

## 2023-07-03 VITALS — BP 108/70 | HR 62 | Temp 97.7°F | Ht 67.0 in | Wt 201.1 lb

## 2023-07-03 DIAGNOSIS — Z13220 Encounter for screening for lipoid disorders: Secondary | ICD-10-CM | POA: Diagnosis not present

## 2023-07-03 DIAGNOSIS — J3089 Other allergic rhinitis: Secondary | ICD-10-CM | POA: Diagnosis not present

## 2023-07-03 DIAGNOSIS — K219 Gastro-esophageal reflux disease without esophagitis: Secondary | ICD-10-CM

## 2023-07-03 LAB — LIPID PANEL
Cholesterol: 206 mg/dL — ABNORMAL HIGH (ref 0–200)
HDL: 56.8 mg/dL (ref >39.00)
LDL Cholesterol: 132 mg/dL — ABNORMAL HIGH (ref 0–99)
NonHDL: 149.6
Total CHOL/HDL Ratio: 4
Triglycerides: 87 mg/dL (ref 0.0–149.0)
VLDL: 17.4 mg/dL (ref 0.0–40.0)

## 2023-07-03 MED ORDER — PANTOPRAZOLE SODIUM 20 MG PO TBEC: 20.0000 mg | DELAYED_RELEASE_TABLET | Freq: Every day | ORAL | 5 refills | Status: AC

## 2023-07-03 MED ORDER — FLUTICASONE PROPIONATE 50 MCG/ACT NA SUSP: NASAL | 3 refills | Status: DC

## 2023-07-03 NOTE — Progress Notes (Signed)
 Patient ID: Melissa Porter, female    DOB: [birth year], 23 y.o.   MRN: [id number]  Chief Complaint  Patient presents with   Abdominal Pain    Pt c/o abdominal pain ,present for 2 weeks. Has tried pepto bismol which did not help. Pain is sharp and constant.      Discussed the use of AI scribe software for clinical note transcription with the patient, who gave verbal consent to proceed.  History of Present Illness   The patient, with a history of acid reflux managed with Protonix, presented with a two-week history of sharp, epigastric pain. The pain was not associated with bloating or tenderness on palpation, suggesting it was not related to gas. The patient also reported a history of nausea and vomiting, which she attributed to possible food poisoning. The pain has since resolved and the patient is no longer experiencing any discomfort. The patient also reported a history of allergies and is currently requesting a nasal spray.     Assessment & Plan:     Gastroesophageal Reflux Disease (GERD) - Intermittent epigastric pain likely due to GERD. Symptoms align more with GERD than gallbladder issues. Discussed dietary factors and lifestyle modifications. - Increase pantoprazole to 40 mg daily or 20 mg twice daily for one week if symptoms worsen again.. - Avoid fried and fatty foods, limit caffeine and acidic foods. - Remain upright for at least one hour after eating. - Consider gallbladder ultrasound if symptoms persist despite treatment. -F/U prn  Allergic Rhinitis - Request for nasal spray for allergy management. - Prescribing fluticasone nasal spray for allergy management, 1 squirt each nostril bid x3d, then qd. -F/U prn     Subjective:    Outpatient Medications Prior to Visit  Medication Sig Dispense Refill   atomoxetine (STRATTERA) 40 MG capsule Take 1 capsule (40 mg total) by mouth daily. 90 capsule 1   FLUTICASONE FUROATE NA Place into the nose.     montelukast (SINGULAIR) 10 MG  tablet Take 1 tablet (10 mg total) by mouth at bedtime. 90 tablet 1   pantoprazole (PROTONIX) 20 MG tablet Take 1 tablet (20 mg total) by mouth daily. 90 tablet 0   TRI-SPRINTEC 0.18/0.215/0.25 MG-35 MCG tablet Take 1 tablet by mouth daily. 84 tablet 3   triamcinolone cream (KENALOG) 0.1 % Apply 1 Application topically 2 (two) times daily. 453.6 g 0   No facility-administered medications prior to visit.   Past Medical History:  Diagnosis Date   Anxiety    Asthma    No past surgical history on file. Allergies  Allergen Reactions   Nickel    Orange Grape Root       Objective:    Physical Exam Vitals and nursing note reviewed.  Constitutional:      Appearance: Normal appearance.  Cardiovascular:     Rate and Rhythm: Normal rate and regular rhythm.  Pulmonary:     Effort: Pulmonary effort is normal.     Breath sounds: Normal breath sounds.  Musculoskeletal:        General: Normal range of motion.  Skin:    General: Skin is warm and dry.  Neurological:     Mental Status: She is alert.  Psychiatric:        Mood and Affect: Mood normal.        Behavior: Behavior normal.   BP 108/70 (BP Location: Left Arm, Patient Position: Sitting, Cuff Size: Large)   Pulse 62   Temp 97.7 F (36.5 C) (Temporal)  Ht 5\' 7"  (1.702 m)   Wt 201 lb 2 oz (91.2 kg)   LMP 06/10/2023 (Exact Date)   SpO2 100%   BMI 31.50 kg/m  Wt Readings from Last 3 Encounters:  07/03/23 201 lb 2 oz (91.2 kg)  05/29/23 202 lb (91.6 kg)  04/28/23 203 lb 4 oz (92.2 kg)       Dulce Sellar, NP

## 2023-07-04 NOTE — Assessment & Plan Note (Signed)
 Stable on Montelukast 10mg  qhs and Flovent inhaler qd. -Continue Montelukast at bedtime and inhaler. -Sending refill of Fluticasone nasal spray, advised on use & SE. -Schedule follow-up appointment in 6 months or prn

## 2023-07-04 NOTE — Assessment & Plan Note (Signed)
 Intermittent epigastric pain likely due to GERD. Symptoms align more with GERD than gallbladder issues. Discussed dietary factors and lifestyle modifications. - Increase pantoprazole to 40 mg daily or 20 mg twice daily for one week if symptoms worsen again.. - Avoid fried and fatty foods, limit caffeine and acidic foods. - Remain upright for at least one hour after eating. - Consider gallbladder ultrasound if symptoms persist despite treatment. -F/U prn

## 2023-07-06 ENCOUNTER — Encounter: Payer: Self-pay | Admitting: Family

## 2023-07-09 ENCOUNTER — Telehealth: Payer: Self-pay

## 2023-07-09 ENCOUNTER — Other Ambulatory Visit (HOSPITAL_COMMUNITY): Payer: Self-pay

## 2023-07-09 NOTE — Telephone Encounter (Signed)
 Pharmacy Patient Advocate Encounter   Received notification from Onbase that prior authorization for Pantoprazole Sodium 20MG  dr tablets is required/requested.   Insurance verification completed.   The patient is insured through  [employer]  .   Per test claim: PA required; PA started via CoverMyMeds. KEY [id number] . Waiting for clinical questions to populate.

## 2023-07-16 NOTE — Telephone Encounter (Signed)
 PA cancelled due to clinical question expired. Submitting new PA.

## 2023-07-20 ENCOUNTER — Other Ambulatory Visit (HOSPITAL_COMMUNITY): Payer: Self-pay

## 2023-07-22 ENCOUNTER — Telehealth: Payer: Self-pay

## 2023-07-22 ENCOUNTER — Other Ambulatory Visit (HOSPITAL_COMMUNITY): Payer: Self-pay

## 2023-07-22 NOTE — Telephone Encounter (Signed)
 Pharmacy Patient Advocate Encounter   Received notification from CoverMyMeds that prior authorization for Pantoprazole Sodium 20MG  dr tablets is required/requested.   Insurance verification completed.   The patient is insured through [facility] .   Per test claim: PA required; PA submitted to above mentioned insurance via CoverMyMeds Key/confirmation #/EOC [id number] Status is pending

## 2023-07-27 ENCOUNTER — Other Ambulatory Visit (HOSPITAL_COMMUNITY): Payer: Self-pay

## 2023-07-27 NOTE — Telephone Encounter (Signed)
 Pharmacy Patient Advocate Encounter  Received notification from [facility] that Prior Authorization for Pantoprazole Sodium 20MG  dr tablets has been APPROVED from 07/27/23 to 07/26/24. Ran test claim, Copay is $4.97. This test claim was processed through [facility]- copay amounts may vary at other pharmacies due to pharmacy/plan contracts, or as the patient moves through the different stages of their insurance plan.   PA #/Case ID/Reference #:  [id number]

## 2023-08-10 ENCOUNTER — Other Ambulatory Visit: Payer: Self-pay | Admitting: Family

## 2023-08-10 DIAGNOSIS — L309 Dermatitis, unspecified: Secondary | ICD-10-CM

## 2023-08-13 ENCOUNTER — Encounter: Payer: Self-pay | Admitting: Family

## 2023-08-13 ENCOUNTER — Ambulatory Visit: Admitting: Family

## 2023-08-13 VITALS — BP 112/73 | HR 88 | Temp 97.5°F | Ht 67.0 in | Wt 195.6 lb

## 2023-08-13 DIAGNOSIS — J3089 Other allergic rhinitis: Secondary | ICD-10-CM | POA: Diagnosis not present

## 2023-08-13 DIAGNOSIS — H938X3 Other specified disorders of ear, bilateral: Secondary | ICD-10-CM

## 2023-08-13 MED ORDER — FLUTICASONE PROPIONATE 50 MCG/ACT NA SUSP: NASAL | 3 refills | Status: DC

## 2023-08-13 MED ORDER — IBUPROFEN 600 MG PO TABS: 600.0000 mg | ORAL_TABLET | Freq: Three times a day (TID) | ORAL | 0 refills | Status: DC | PRN

## 2023-08-13 MED ORDER — NEOMYCIN-POLYMYXIN-HC 3.5-10000-1 OT SOLN: 3.0000 [drp] | Freq: Four times a day (QID) | OTIC | 0 refills | Status: DC

## 2023-08-13 NOTE — Progress Notes (Signed)
 Patient ID: Melissa Porter, female    DOB: 2000-09-23, 23 y.o.   MRN: [id number]  Chief Complaint  Patient presents with   Ear Pain    Pt c/o bilateral ear pain, present for today.   Discussed the use of AI scribe software for clinical note transcription with the patient, who gave verbal consent to proceed.  History of Present Illness The patient presents with ear pain that started this morning. She describes the pain as a constant pressure, not throbbing, and it has persisted throughout the day. She reports that her ears have been itchy, particularly on the inside. She admits to using a bobby pin to scratch the inside of her ears. She also reports nasal congestion and tenderness under her nose. She denies any recent cold symptoms but reports nasal congestion and drainage, and has been outside with her children on the playground. She did not pick up the allergy medicine Flonase  sent in for her last month. She is not currently using any over-the-counter allergy medications for these symptoms.  Assessment & Plan Nasal congestion and runny nose - Acute nasal congestion and rhinorrhea with facial pressure. Recommended nasal corticosteroid spray for symptom management. Discussed Flonase  use and potential side effects of prednisone. - Use Flonase  (fluticasone ) nasal spray, one spray in each nostril twice daily for three days, then reduce to once daily. - Add Claritin or Zyrtec if nasal spray is insufficient. - Prescribe prescription-strength ibuprofen  (600 mg) three times a day with food for pain and inflammation. - Follow up if symptoms persist or worsen.  Ear canal irritation - Ear canal irritation with pressure and pain, likely due to bobby pin use. Advised against bobby pin use and recommended hydrocortisone ear drops. - Prescribing antibiotic/hydrocortisone ear drops, two to three drops in each ear up to 4x/d until better, no longer than 1 week. - Avoid using bobby pins in the ear canal, can  use a Q-tip just on the inner edge of ear gently,         Subjective:    Outpatient Medications Prior to Visit  Medication Sig Dispense Refill   atomoxetine  (STRATTERA ) 40 MG capsule Take 1 capsule (40 mg total) by mouth daily. 90 capsule 1   fluticasone  (FLONASE ) 50 MCG/ACT nasal spray 1 spray each nostril twice a day for 1 week, then reduce to 1 spray each nostril daily. 16 g 3   montelukast  (SINGULAIR ) 10 MG tablet Take 1 tablet (10 mg total) by mouth at bedtime. 90 tablet 1   pantoprazole  (PROTONIX ) 20 MG tablet Take 1 tablet (20 mg total) by mouth daily. OK to take 2 pills daily or can take 1 pill twice a day for 1 week if symptoms are worse. 45 tablet 5   TRI-SPRINTEC  0.18/0.215/0.25 MG-35 MCG tablet Take 1 tablet by mouth daily. 84 tablet 3   triamcinolone  cream (KENALOG ) 0.1 % APPLY 1 APPLICATION EXTERNALLY TWICE DAILY 454 g 0   No facility-administered medications prior to visit.   Past Medical History:  Diagnosis Date   Anxiety    Asthma    No past surgical history on file. Allergies  Allergen Reactions   Nickel    Orange Grape Root       Objective:    Physical Exam Vitals and nursing note reviewed.  Constitutional:      Appearance: Normal appearance. She is ill-appearing.     Interventions: Face mask in place.  HENT:     Right Ear: Tympanic membrane and ear canal normal.  Swelling (mild, with erythema) and tenderness present.     Left Ear: Tympanic membrane and ear canal normal. Swelling (mild, w/mild erythema) and tenderness present.     Nose: Congestion and rhinorrhea present. Rhinorrhea is clear.     Right Sinus: Frontal sinus tenderness present.     Left Sinus: Frontal sinus tenderness present.     Mouth/Throat:     Mouth: Mucous membranes are moist.     Pharynx: No pharyngeal swelling, oropharyngeal exudate, posterior oropharyngeal erythema or uvula swelling.     Tonsils: No tonsillar exudate or tonsillar abscesses.  Cardiovascular:     Rate and Rhythm:  Normal rate and regular rhythm.  Pulmonary:     Effort: Pulmonary effort is normal.     Breath sounds: Normal breath sounds.  Musculoskeletal:        General: Normal range of motion.  Lymphadenopathy:     Head:     Right side of head: No preauricular or posterior auricular adenopathy.     Left side of head: No preauricular or posterior auricular adenopathy.     Cervical: No cervical adenopathy.  Skin:    General: Skin is warm and dry.  Neurological:     Mental Status: She is alert.  Psychiatric:        Mood and Affect: Mood normal.        Behavior: Behavior normal.    BP 112/73 (BP Location: Left Arm, Patient Position: Sitting, Cuff Size: Large)   Pulse 88   Temp (!) 97.5 F (36.4 C) (Temporal)   Ht 5\' 7"  (1.702 m)   Wt 195 lb 9.6 oz (88.7 kg)   LMP 08/05/2023 (Approximate)   SpO2 100%   BMI 30.64 kg/m  Wt Readings from Last 3 Encounters:  08/13/23 195 lb 9.6 oz (88.7 kg)  07/03/23 201 lb 2 oz (91.2 kg)  05/29/23 202 lb (91.6 kg)      Versa Gore, NP

## 2023-08-14 ENCOUNTER — Telehealth: Payer: Self-pay | Admitting: Family

## 2023-08-14 ENCOUNTER — Encounter: Payer: Self-pay | Admitting: Family

## 2023-08-14 NOTE — Telephone Encounter (Signed)
 Copied from CRM [id number]. Topic: General - Other >> Aug 14, 2023  8:26 AM Adaysia C wrote: Reason for CRM: Patient was seen in the clinic yesterday(08/13/2023) by Versa Gore, NP and forgot to pick up her doctors note before she left; Patient needs a doctors note work excuse for 08/13/2023 and 08/14/2023; Her job is requesting her work note today, please follow up with patient on when she can pick it up from the clinic [phone]

## 2023-08-14 NOTE — Telephone Encounter (Signed)
 I returned pt's call, sent via MyChart and printed for pick up as pt requested.

## 2023-10-05 ENCOUNTER — Telehealth: Payer: Self-pay

## 2023-10-05 NOTE — Telephone Encounter (Signed)
 Copied from CRM [id number]. Topic: Clinical - Request for Lab/Test Order >> Oct 05, 2023  1:33 PM Magdalene School wrote: Reason for CRM: Patient is requesting to get cholesterol labs done at the end of July. Please contact patient for scheduling when order is placed.  Message has been sent to provider to address

## 2023-10-06 NOTE — Telephone Encounter (Signed)
 ok to enter future lab - remind patient prefer her to be fasting, thx

## 2023-10-07 ENCOUNTER — Other Ambulatory Visit: Payer: Self-pay

## 2023-10-07 DIAGNOSIS — Z13220 Encounter for screening for lipoid disorders: Secondary | ICD-10-CM

## 2023-10-07 NOTE — Telephone Encounter (Signed)
 Future lipid panel put in for 11/10/2023. Called patient and phone went to voicemail, left voicemail to call office to get on schedule.

## 2023-10-08 NOTE — Telephone Encounter (Signed)
 Pt has made follow up appt , via his my chart a acct. , no further action needed

## 2023-11-18 ENCOUNTER — Other Ambulatory Visit: Payer: Self-pay | Admitting: Medical Genetics

## 2023-11-20 ENCOUNTER — Ambulatory Visit: Payer: 59 | Admitting: Family

## 2023-11-23 ENCOUNTER — Ambulatory Visit (INDEPENDENT_AMBULATORY_CARE_PROVIDER_SITE_OTHER): Admitting: Family

## 2023-11-23 VITALS — BP 114/64 | HR 71 | Temp 97.5°F | Ht 67.0 in | Wt 192.0 lb

## 2023-11-23 DIAGNOSIS — E782 Mixed hyperlipidemia: Secondary | ICD-10-CM | POA: Diagnosis not present

## 2023-11-23 NOTE — Progress Notes (Signed)
 Patient ID: Melissa Porter, female    DOB: June 09, 2000, 23 y.o.   MRN: [id number]  Chief Complaint  Patient presents with   Knee Pain    Pt c/o bilateral knee pain/cracking sounds off and on.   Hyperlipidemia  Discussed the use of AI scribe software for clinical note transcription with the patient, who gave verbal consent to proceed.  History of Present Illness Melissa Porter is a 23 year old female who presents for a follow-up on her knee and back pain.  Knee popping and swelling - Popping sound in the knee, more pronounced in one knee - Swelling present in the affected knee - No associated pain with the popping or swelling - History of significant kneecap pain following a fall in elementary school - Engages in weightlifting and cardio, with the belief that strengthening the knees may improve symptoms  Middle back pain - Middle back pain, primarily on the right side - Pain attributed to pillow use - Pain not exacerbated by pressure unless pressed hard - Pain occurs when leaning in a certain direction - Practices careful form during weightlifting - Experimenting with different pillows to alleviate discomfort  Assessment & Plan Elevated cholesterol Cholesterol levels improved with potential further improvement from weight loss and exercise. - Recheck cholesterol levels today. - F/U in 36m-55yr  Knee popping, non-painful Bilateral non-painful mild popping likely due to air in the joint. No significant injury history. Not concerning unless painful or swelling occurs. - Continue gradual increase in exercise and weightlifting. - Monitor for pain or increased swelling. - Use ice, acetaminophen , or ibuprofen  if pain or swelling occurs and call if sx are not resolved in 2weeks. - Avoid aggravating activities if symptoms worsen. - Contact provider if symptoms do not improve after a couple of weeks.  Mild mid-back pain, likely musculoskeletal Mild mid-back pain likely musculoskeletal,  possibly related to pillow use or weightlifting. Pain not severe, only when moving to one side, no pain with palpation on exam. - Use heat therapy for up to 30 minutes several times a day. - Consider over-the-counter lidocaine or other analgesic patches for pain relief every 6-8 hours. - Monitor for worsening symptoms and contact provider if necessary.   Subjective:    Outpatient Medications Prior to Visit  Medication Sig Dispense Refill   atomoxetine  (STRATTERA ) 40 MG capsule Take 1 capsule (40 mg total) by mouth daily. 90 capsule 1   fluticasone  (FLONASE ) 50 MCG/ACT nasal spray 1 spray each nostril twice a day for 1 week, then reduce to 1 spray each nostril daily. 16 g 3   montelukast  (SINGULAIR ) 10 MG tablet Take 1 tablet (10 mg total) by mouth at bedtime. 90 tablet 1   pantoprazole  (PROTONIX ) 20 MG tablet Take 1 tablet (20 mg total) by mouth daily. OK to take 2 pills daily or can take 1 pill twice a day for 1 week if symptoms are worse. 45 tablet 5   TRI-SPRINTEC  0.18/0.215/0.25 MG-35 MCG tablet Take 1 tablet by mouth daily. 84 tablet 3   triamcinolone  cream (KENALOG ) 0.1 % APPLY 1 APPLICATION EXTERNALLY TWICE DAILY 454 g 0   ibuprofen  (ADVIL ) 600 MG tablet Take 1 tablet (600 mg total) by mouth 3 (three) times daily as needed for mild pain (pain score 1-3) or moderate pain (pain score 4-6) (Take after eating.). (Patient not taking: Reported on 11/23/2023) 30 tablet 0   neomycin -polymyxin-hydrocortisone (CORTISPORIN) OTIC solution Place 3 drops into both ears 4 (four) times daily. For itching, irritation  in ears. (Patient not taking: Reported on 11/23/2023) 10 mL 0   No facility-administered medications prior to visit.   Past Medical History:  Diagnosis Date   Anxiety    Asthma    No past surgical history on file. Allergies  Allergen Reactions   Nickel    Orange Grape Root       Objective:    Physical Exam Vitals and nursing note reviewed.  Constitutional:      Appearance: Normal  appearance.  Cardiovascular:     Rate and Rhythm: Normal rate and regular rhythm.  Pulmonary:     Effort: Pulmonary effort is normal.     Breath sounds: Normal breath sounds.  Musculoskeletal:        General: Normal range of motion.     Thoracic back: No swelling, tenderness or bony tenderness. Normal range of motion.     Lumbar back: No swelling, tenderness or bony tenderness. Normal range of motion.     Right knee: No swelling or bony tenderness. Normal range of motion. No tenderness.     Left knee: No swelling or bony tenderness. Normal range of motion. No tenderness.  Skin:    General: Skin is warm and dry.  Neurological:     Mental Status: She is alert.  Psychiatric:        Mood and Affect: Mood normal.        Behavior: Behavior normal.    BP 114/64 (BP Location: Left Arm, Patient Position: Sitting, Cuff Size: Large)   Pulse 71   Temp (!) 97.5 F (36.4 C) (Temporal)   Ht 5' 7 (1.702 m)   Wt 192 lb (87.1 kg)   LMP 10/28/2023 (Exact Date)   SpO2 100%   BMI 30.07 kg/m  Wt Readings from Last 3 Encounters:  11/23/23 192 lb (87.1 kg)  08/13/23 195 lb 9.6 oz (88.7 kg)  07/03/23 201 lb 2 oz (91.2 kg)     Lucius Krabbe, NP

## 2023-11-24 LAB — LIPID PANEL
Cholesterol: 227 mg/dL — ABNORMAL HIGH (ref 0–200)
HDL: 63.5 mg/dL (ref >39.00)
LDL Cholesterol: 137 mg/dL — ABNORMAL HIGH (ref 0–99)
NonHDL: 163.4
Total CHOL/HDL Ratio: 4
Triglycerides: 132 mg/dL (ref 0.0–149.0)
VLDL: 26.4 mg/dL (ref 0.0–40.0)

## 2023-11-25 ENCOUNTER — Ambulatory Visit: Payer: Self-pay | Admitting: Family

## 2023-11-29 ENCOUNTER — Telehealth: Admitting: Family

## 2023-11-29 DIAGNOSIS — J069 Acute upper respiratory infection, unspecified: Secondary | ICD-10-CM | POA: Diagnosis not present

## 2023-11-29 MED ORDER — FLUTICASONE PROPIONATE 50 MCG/ACT NA SUSP: 2.0000 | Freq: Every day | NASAL | 6 refills | Status: AC

## 2023-11-29 MED ORDER — BENZONATATE 100 MG PO CAPS: 100.0000 mg | ORAL_CAPSULE | Freq: Three times a day (TID) | ORAL | 0 refills | Status: DC | PRN

## 2023-11-29 NOTE — Progress Notes (Signed)

## 2023-11-30 ENCOUNTER — Other Ambulatory Visit (HOSPITAL_COMMUNITY)

## 2023-12-15 ENCOUNTER — Other Ambulatory Visit: Payer: Self-pay | Admitting: Family

## 2023-12-15 DIAGNOSIS — Z3041 Encounter for surveillance of contraceptive pills: Secondary | ICD-10-CM

## 2023-12-15 DIAGNOSIS — F900 Attention-deficit hyperactivity disorder, predominantly inattentive type: Secondary | ICD-10-CM

## 2023-12-25 ENCOUNTER — Other Ambulatory Visit (HOSPITAL_COMMUNITY)
Admission: RE | Admit: 2023-12-25 | Discharge: 2023-12-25 | Disposition: A | Discharge status: Home / Self Care | Payer: Self-pay | Source: Ambulatory Visit | Attending: Medical Genetics | Admitting: Medical Genetics

## 2024-01-03 LAB — GENECONNECT MOLECULAR SCREEN: Genetic Analysis Overall Interpretation: NEGATIVE

## 2024-02-05 ENCOUNTER — Telehealth: Admitting: Physician Assistant

## 2024-02-05 DIAGNOSIS — R3989 Other symptoms and signs involving the genitourinary system: Secondary | ICD-10-CM

## 2024-02-06 MED ORDER — CEPHALEXIN 500 MG PO CAPS: 500.0000 mg | ORAL_CAPSULE | Freq: Two times a day (BID) | ORAL | 0 refills | Status: AC

## 2024-02-06 NOTE — Progress Notes (Signed)

## 2024-03-15 ENCOUNTER — Other Ambulatory Visit: Payer: Self-pay | Admitting: Family

## 2024-03-15 DIAGNOSIS — F900 Attention-deficit hyperactivity disorder, predominantly inattentive type: Secondary | ICD-10-CM

## 2024-03-15 DIAGNOSIS — Z3041 Encounter for surveillance of contraceptive pills: Secondary | ICD-10-CM

## 2024-04-07 ENCOUNTER — Ambulatory Visit: Admitting: Family

## 2024-04-07 ENCOUNTER — Encounter: Payer: Self-pay | Admitting: Family

## 2024-04-07 ENCOUNTER — Other Ambulatory Visit (HOSPITAL_COMMUNITY)
Admission: RE | Admit: 2024-04-07 | Discharge: 2024-04-07 | Disposition: A | Source: Ambulatory Visit | Attending: Family | Admitting: Family

## 2024-04-07 VITALS — BP 109/52 | HR 64 | Temp 97.9°F | Ht 67.0 in | Wt 176.0 lb

## 2024-04-07 DIAGNOSIS — L509 Urticaria, unspecified: Secondary | ICD-10-CM

## 2024-04-07 DIAGNOSIS — R1022 Pelvic and perineal pain left side: Secondary | ICD-10-CM | POA: Diagnosis not present

## 2024-04-07 DIAGNOSIS — Z113 Encounter for screening for infections with a predominantly sexual mode of transmission: Secondary | ICD-10-CM

## 2024-04-07 NOTE — Progress Notes (Unsigned)
° °  Patient ID: Melissa Porter, female    DOB: 06-04-2000, 23 y.o.   MRN: [id number]  Chief Complaint  Patient presents with   Pelvic Pain    Pt c/o left sided pelvic pain, present for 2 months after sexual intercourse. Pain is off and on.    STD     Patient would like STD testing. Patient denies any sx.     Subjective:    Outpatient Medications Prior to Visit  Medication Sig Dispense Refill   atomoxetine  (STRATTERA ) 40 MG capsule Take 1 capsule by mouth once daily 90 capsule 0   fluticasone  (FLONASE ) 50 MCG/ACT nasal spray Place 2 sprays into both nostrils daily. 16 g 6   montelukast  (SINGULAIR ) 10 MG tablet TAKE 1 TABLET BY MOUTH AT BEDTIME 90 tablet 0   pantoprazole  (PROTONIX ) 20 MG tablet Take 1 tablet (20 mg total) by mouth daily. OK to take 2 pills daily or can take 1 pill twice a day for 1 week if symptoms are worse. 45 tablet 5   TRI-SPRINTEC  0.18/0.215/0.25 MG-35 MCG tablet Take 1 tablet by mouth daily. 84 tablet 3   triamcinolone  cream (KENALOG ) 0.1 % APPLY 1 APPLICATION EXTERNALLY TWICE DAILY 454 g 0   No facility-administered medications prior to visit.   Past Medical History:  Diagnosis Date   Anxiety    Asthma    No past surgical history on file. Allergies[1]    Objective:    Physical Exam Vitals and nursing note reviewed.  Constitutional:      Appearance: Normal appearance.  Cardiovascular:     Rate and Rhythm: Normal rate and regular rhythm.  Pulmonary:     Effort: Pulmonary effort is normal.     Breath sounds: Normal breath sounds.  Musculoskeletal:        General: Normal range of motion.  Skin:    General: Skin is warm and dry.  Neurological:     Mental Status: She is alert.  Psychiatric:        Mood and Affect: Mood normal.        Behavior: Behavior normal.    BP (!) 109/52 (BP Location: Left Arm, Patient Position: Sitting, Cuff Size: Large)   Pulse 64   Temp 97.9 F (36.6 C) (Temporal)   Ht 5' 7 (1.702 m)   Wt 176 lb (79.8 kg)   LMP  03/15/2024 (Exact Date)   SpO2 99%   BMI 27.57 kg/m  Wt Readings from Last 3 Encounters:  04/07/24 176 lb (79.8 kg)  11/23/23 192 lb (87.1 kg)  08/13/23 195 lb 9.6 oz (88.7 kg)       Lucius Krabbe, NP     [1]  Allergies Allergen Reactions   Nickel    Orange Grape Root

## 2024-04-08 ENCOUNTER — Ambulatory Visit: Payer: Self-pay | Admitting: Family

## 2024-04-08 LAB — URINE CYTOLOGY ANCILLARY ONLY
Chlamydia: NEGATIVE
Comment: NEGATIVE
Comment: NEGATIVE
Comment: NORMAL
Neisseria Gonorrhea: NEGATIVE
Trichomonas: NEGATIVE

## 2024-04-25 ENCOUNTER — Other Ambulatory Visit: Payer: Self-pay | Admitting: Family

## 2024-04-25 DIAGNOSIS — K219 Gastro-esophageal reflux disease without esophagitis: Secondary | ICD-10-CM

## 2024-04-29 ENCOUNTER — Ambulatory Visit: Payer: Self-pay | Admitting: Internal Medicine

## 2024-06-10 ENCOUNTER — Encounter: Payer: Self-pay | Admitting: Obstetrics and Gynecology
